# Patient Record
Sex: Male | Born: 1950 | Race: White | Hispanic: No | Marital: Married | Smoking: Never smoker
Health system: Southern US, Community
[De-identification: ages and names within clinical notes are randomized; demographics above are authoritative.]

## PROBLEM LIST (undated history)

## (undated) DIAGNOSIS — I10 Essential (primary) hypertension: Secondary | ICD-10-CM

## (undated) DIAGNOSIS — T753XXA Motion sickness, initial encounter: Secondary | ICD-10-CM

---

## 2010-10-23 ENCOUNTER — Emergency Department (HOSPITAL_COMMUNITY)
Admission: EM | Admit: 2010-10-23 | Discharge: 2010-10-23 | Disposition: A | Discharge status: Home / Self Care | Payer: 59 | Attending: Emergency Medicine | Admitting: Emergency Medicine

## 2010-10-23 ENCOUNTER — Emergency Department (HOSPITAL_COMMUNITY): Payer: 59

## 2010-10-23 DIAGNOSIS — R209 Unspecified disturbances of skin sensation: Secondary | ICD-10-CM | POA: Insufficient documentation

## 2010-10-23 DIAGNOSIS — M79609 Pain in unspecified limb: Secondary | ICD-10-CM | POA: Insufficient documentation

## 2010-10-23 DIAGNOSIS — R0789 Other chest pain: Secondary | ICD-10-CM | POA: Insufficient documentation

## 2010-11-09 ENCOUNTER — Ambulatory Visit: Payer: 59 | Admitting: Family Medicine

## 2020-08-29 ENCOUNTER — Other Ambulatory Visit: Payer: Self-pay | Admitting: Orthopedic Surgery

## 2020-08-29 DIAGNOSIS — M25511 Pain in right shoulder: Secondary | ICD-10-CM

## 2020-09-06 ENCOUNTER — Ambulatory Visit
Admission: RE | Admit: 2020-09-06 | Discharge: 2020-09-06 | Disposition: A | Discharge status: Home / Self Care | Payer: 59 | Source: Ambulatory Visit | Attending: Orthopedic Surgery | Admitting: Orthopedic Surgery

## 2020-09-06 ENCOUNTER — Other Ambulatory Visit: Payer: Self-pay

## 2020-09-06 DIAGNOSIS — M25511 Pain in right shoulder: Secondary | ICD-10-CM

## 2020-09-20 ENCOUNTER — Other Ambulatory Visit: Payer: Self-pay | Admitting: Orthopedic Surgery

## 2020-09-21 ENCOUNTER — Encounter: Payer: Self-pay | Admitting: Orthopedic Surgery

## 2020-09-30 ENCOUNTER — Ambulatory Visit
Admission: RE | Admit: 2020-09-30 | Discharge: 2020-09-30 | Disposition: A | Discharge status: Home / Self Care | Payer: 59 | Attending: Orthopedic Surgery | Admitting: Orthopedic Surgery

## 2020-09-30 ENCOUNTER — Ambulatory Visit: Payer: 59 | Admitting: Anesthesiology

## 2020-09-30 ENCOUNTER — Encounter
Admission: RE | Disposition: A | Discharge status: Home / Self Care | Payer: Self-pay | Source: Home / Self Care | Attending: Orthopedic Surgery

## 2020-09-30 ENCOUNTER — Encounter: Payer: Self-pay | Admitting: Orthopedic Surgery

## 2020-09-30 ENCOUNTER — Other Ambulatory Visit: Payer: Self-pay

## 2020-09-30 DIAGNOSIS — Z6828 Body mass index (BMI) 28.0-28.9, adult: Secondary | ICD-10-CM | POA: Insufficient documentation

## 2020-09-30 DIAGNOSIS — M25411 Effusion, right shoulder: Secondary | ICD-10-CM | POA: Diagnosis not present

## 2020-09-30 DIAGNOSIS — M25811 Other specified joint disorders, right shoulder: Secondary | ICD-10-CM | POA: Insufficient documentation

## 2020-09-30 DIAGNOSIS — W109XXA Fall (on) (from) unspecified stairs and steps, initial encounter: Secondary | ICD-10-CM | POA: Insufficient documentation

## 2020-09-30 DIAGNOSIS — S46011A Strain of muscle(s) and tendon(s) of the rotator cuff of right shoulder, initial encounter: Secondary | ICD-10-CM | POA: Insufficient documentation

## 2020-09-30 DIAGNOSIS — M7521 Bicipital tendinitis, right shoulder: Secondary | ICD-10-CM | POA: Diagnosis not present

## 2020-09-30 DIAGNOSIS — M7551 Bursitis of right shoulder: Secondary | ICD-10-CM | POA: Insufficient documentation

## 2020-09-30 DIAGNOSIS — Z79899 Other long term (current) drug therapy: Secondary | ICD-10-CM | POA: Diagnosis not present

## 2020-09-30 HISTORY — PX: SHOULDER ARTHROSCOPY WITH SUBACROMIAL DECOMPRESSION AND OPEN ROTATOR C: SHX5688

## 2020-09-30 HISTORY — DX: Essential (primary) hypertension: I10

## 2020-09-30 HISTORY — DX: Motion sickness, initial encounter: T75.3XXA

## 2020-09-30 SURGERY — SHOULDER ARTHROSCOPY WITH SUBACROMIAL DECOMPRESSION AND OPEN ROTATOR CUFF REPAIR, OPEN BICEPS TENDON REPAIR
Anesthesia: Regional | Site: Shoulder | Laterality: Right

## 2020-09-30 MED ORDER — EPHEDRINE SULFATE 50 MG/ML IJ SOLN
INTRAMUSCULAR | Status: DC | PRN
  Administered 2020-09-30: 5 mg via INTRAVENOUS
  Administered 2020-09-30 (×2): 10 mg via INTRAVENOUS

## 2020-09-30 MED ORDER — LIDOCAINE HCL (CARDIAC) PF 100 MG/5ML IV SOSY
PREFILLED_SYRINGE | INTRAVENOUS | Status: DC | PRN
  Administered 2020-09-30: 30 mg via INTRATRACHEAL

## 2020-09-30 MED ORDER — BUPIVACAINE LIPOSOME 1.3 % IJ SUSP
INTRAMUSCULAR | Status: DC | PRN
  Administered 2020-09-30: 20 mL via PERINEURAL

## 2020-09-30 MED ORDER — MIDAZOLAM HCL 2 MG/2ML IJ SOLN
INTRAMUSCULAR | Status: DC | PRN
  Administered 2020-09-30: 2 mg via INTRAVENOUS

## 2020-09-30 MED ORDER — OXYCODONE HCL 5 MG PO TABS: 5.0000 mg | ORAL_TABLET | ORAL | 0 refills | Status: AC | PRN

## 2020-09-30 MED ORDER — PROPOFOL 10 MG/ML IV BOLUS
INTRAVENOUS | Status: DC | PRN
  Administered 2020-09-30: 150 mg via INTRAVENOUS
  Administered 2020-09-30: 50 mg via INTRAVENOUS

## 2020-09-30 MED ORDER — OXYCODONE HCL 5 MG PO TABS: 5.0000 mg | ORAL_TABLET | Freq: Once | ORAL | Status: DC | PRN

## 2020-09-30 MED ORDER — ONDANSETRON HCL 4 MG/2ML IJ SOLN
INTRAMUSCULAR | Status: DC | PRN
  Administered 2020-09-30: 4 mg via INTRAVENOUS

## 2020-09-30 MED ORDER — ACETAMINOPHEN 500 MG PO TABS: 1000.0000 mg | ORAL_TABLET | Freq: Three times a day (TID) | ORAL | 2 refills | Status: AC

## 2020-09-30 MED ORDER — FENTANYL CITRATE (PF) 100 MCG/2ML IJ SOLN
INTRAMUSCULAR | Status: DC | PRN
  Administered 2020-09-30: 100 ug via INTRAVENOUS

## 2020-09-30 MED ORDER — CEFAZOLIN SODIUM-DEXTROSE 2-4 GM/100ML-% IV SOLN
2.0000 g | INTRAVENOUS | Status: AC
  Administered 2020-09-30: 2 g via INTRAVENOUS

## 2020-09-30 MED ORDER — LACTATED RINGERS IV SOLN: INTRAVENOUS | Status: DC

## 2020-09-30 MED ORDER — BUPIVACAINE HCL (PF) 0.5 % IJ SOLN
INTRAMUSCULAR | Status: DC | PRN
  Administered 2020-09-30: 20 mL via PERINEURAL

## 2020-09-30 MED ORDER — DEXAMETHASONE SODIUM PHOSPHATE 4 MG/ML IJ SOLN
INTRAMUSCULAR | Status: DC | PRN
  Administered 2020-09-30: 4 mg via INTRAVENOUS

## 2020-09-30 MED ORDER — OXYCODONE HCL 5 MG/5ML PO SOLN: 5.0000 mg | Freq: Once | ORAL | Status: DC | PRN

## 2020-09-30 MED ORDER — ASPIRIN EC 325 MG PO TBEC: 325.0000 mg | DELAYED_RELEASE_TABLET | Freq: Every day | ORAL | 0 refills | Status: AC

## 2020-09-30 MED ORDER — CEFAZOLIN SODIUM-DEXTROSE 1-4 GM/50ML-% IV SOLN
INTRAVENOUS | Status: DC | PRN
  Administered 2020-09-30: 1 g via INTRAVENOUS

## 2020-09-30 MED ORDER — LACTATED RINGERS IR SOLN
Status: DC | PRN
  Administered 2020-09-30: 12000 mL

## 2020-09-30 MED ORDER — ONDANSETRON 4 MG PO TBDP: 4.0000 mg | ORAL_TABLET | Freq: Three times a day (TID) | ORAL | 0 refills | Status: AC | PRN

## 2020-09-30 MED ORDER — LACTATED RINGERS IV SOLN
INTRAVENOUS | Status: DC | PRN
  Administered 2020-09-30: 12000 mL

## 2020-09-30 SURGICAL SUPPLY — 61 items
ADAPTER IRRIG TUBE 2 SPIKE SOL (ADAPTER) ×6 IMPLANT
ADH SKN CLS APL DERMABOND .7 (GAUZE/BANDAGES/DRESSINGS) ×1
ADPR TBG 2 SPK PMP STRL ASCP (ADAPTER) ×2
ANCH SUT 2 SWLK 19.1 CLS EYLT (Anchor) ×1 IMPLANT
ANCH SUT 2.9 PUSHLOCK ANCH (Orthopedic Implant) ×1 IMPLANT
ANCHOR ICONIX SPEED 2.3 (Anchor) ×2 IMPLANT
ANCHOR ICONIX SPEED 2.3MM (Anchor) ×1 IMPLANT
ANCHOR SWIVELOCK BIO 4.75X19.1 (Anchor) ×3 IMPLANT
APL PRP STRL LF DISP 70% ISPRP (MISCELLANEOUS) ×1
BUR BR 5.5 12 FLUTE (BURR) ×3 IMPLANT
BUR RADIUS 4.0X18.5 (BURR) ×3 IMPLANT
CANNULA 5.75X7CM (CANNULA) ×1
CANNULA PART THRD DISP 5.75X7 (CANNULA) ×2 IMPLANT
CANNULA PARTIAL THREAD 2X7 (CANNULA) ×3 IMPLANT
CANNULA TWIST IN 8.25X7CM (CANNULA) ×3 IMPLANT
CHLORAPREP W/TINT 26 (MISCELLANEOUS) ×3 IMPLANT
COOLER POLAR GLACIER W/PUMP (MISCELLANEOUS) ×3 IMPLANT
COVER LIGHT HANDLE UNIVERSAL (MISCELLANEOUS) ×6 IMPLANT
DERMABOND ADVANCED (GAUZE/BANDAGES/DRESSINGS) ×2
DERMABOND ADVANCED .7 DNX12 (GAUZE/BANDAGES/DRESSINGS) ×1 IMPLANT
DRAPE IMP U-DRAPE 54X76 (DRAPES) ×6 IMPLANT
DRAPE INCISE IOBAN 66X45 STRL (DRAPES) ×3 IMPLANT
DRAPE U-SHAPE 48X52 POLY STRL (PACKS) ×6 IMPLANT
DRSG TEGADERM 4X4.75 (GAUZE/BANDAGES/DRESSINGS) ×15 IMPLANT
ELECT REM PT RETURN 9FT ADLT (ELECTROSURGICAL) ×3
ELECTRODE REM PT RTRN 9FT ADLT (ELECTROSURGICAL) ×1 IMPLANT
GAUZE SPONGE 4X4 12PLY STRL (GAUZE/BANDAGES/DRESSINGS) ×3 IMPLANT
GAUZE XEROFORM 1X8 LF (GAUZE/BANDAGES/DRESSINGS) ×3 IMPLANT
GLOVE SRG 8 PF TXTR STRL LF DI (GLOVE) ×2 IMPLANT
GLOVE SURG ENC MOIS LTX SZ7.5 (GLOVE) ×6 IMPLANT
GLOVE SURG UNDER POLY LF SZ8 (GLOVE) ×6
GOWN STRL REIN 2XL XLG LVL4 (GOWN DISPOSABLE) ×3 IMPLANT
GOWN STRL REUS W/ TWL LRG LVL3 (GOWN DISPOSABLE) ×1 IMPLANT
GOWN STRL REUS W/TWL LRG LVL3 (GOWN DISPOSABLE) ×3
IV LACTATED RINGER IRRG 3000ML (IV SOLUTION) ×24
IV LR IRRIG 3000ML ARTHROMATIC (IV SOLUTION) ×8 IMPLANT
KIT STABILIZATION SHOULDER (MISCELLANEOUS) ×3 IMPLANT
KIT TURNOVER KIT A (KITS) ×3 IMPLANT
MANIFOLD 4PT FOR NEPTUNE1 (MISCELLANEOUS) ×3 IMPLANT
MASK FACE SPIDER DISP (MASK) ×3 IMPLANT
MAT ABSORB  FLUID 56X50 GRAY (MISCELLANEOUS) ×6
MAT ABSORB FLUID 56X50 GRAY (MISCELLANEOUS) ×2 IMPLANT
NDL SAFETY ECLIPSE 18X1.5 (NEEDLE) ×1 IMPLANT
NEEDLE HYPO 18GX1.5 SHARP (NEEDLE) ×3
PACK ARTHROSCOPY SHOULDER (MISCELLANEOUS) ×3 IMPLANT
PAD WRAPON POLAR SHDR XLG (MISCELLANEOUS) ×1 IMPLANT
PASSER SUT FIRSTPASS SELF (INSTRUMENTS) ×3 IMPLANT
PENCIL SMOKE EVACUATOR (MISCELLANEOUS) ×3 IMPLANT
SET TUBE SUCT SHAVER OUTFL 24K (TUBING) ×3 IMPLANT
SPONGE GAUZE 2X2 8PLY STER LF (GAUZE/BANDAGES/DRESSINGS) ×1
SPONGE GAUZE 2X2 8PLY STRL LF (GAUZE/BANDAGES/DRESSINGS) ×2 IMPLANT
SUT ETHILON 3-0 (SUTURE) ×3 IMPLANT
SUT PROLENE 2 0 CT2 30 (SUTURE) ×3 IMPLANT
SYR 10ML LL (SYRINGE) ×3 IMPLANT
SYSTEM IMPL TENODESIS LNT 2.9 (Orthopedic Implant) ×3 IMPLANT
TAPE MICROFOAM 4IN (TAPE) ×3 IMPLANT
TUBING ARTHRO INFLOW-ONLY STRL (TUBING) ×3 IMPLANT
TUBING CONNECTING 10 (TUBING) ×2 IMPLANT
TUBING CONNECTING 10' (TUBING) ×1
WAND WEREWOLF FLOW 90D (MISCELLANEOUS) ×3 IMPLANT
WRAPON POLAR PAD SHDR XLG (MISCELLANEOUS) ×3

## 2020-09-30 NOTE — Transfer of Care (Signed)
Immediate Anesthesia Transfer of Care Note  Patient: Patrick Fowler  Procedure(s) Performed: Right shoulder arthroscopic vs mini-open rotator cuff repair, subacromial decompression, and biceps tenodesis - Dedra Skeens to assist (Right: Shoulder)  Patient Location: PACU  Anesthesia Type: General LMA, Regional  Level of Consciousness: awake, alert  and patient cooperative  Airway and Oxygen Therapy: Patient Spontanous Breathing and Patient connected to supplemental oxygen  Post-op Assessment: Post-op Vital signs reviewed, Patient's Cardiovascular Status Stable, Respiratory Function Stable, Patent Airway and No signs of Nausea or vomiting  Post-op Vital Signs: Reviewed and stable  Complications: No notable events documented.

## 2020-09-30 NOTE — Anesthesia Preprocedure Evaluation (Signed)
Anesthesia Evaluation  Patient identified by MRN, date of birth, ID band Patient awake    Reviewed: NPO status   History of Anesthesia Complications Negative for: history of anesthetic complications  Airway Mallampati: II  TM Distance: >3 FB Neck ROM: full   Comment: +beard Dental no notable dental hx.    Pulmonary neg pulmonary ROS,    Pulmonary exam normal        Cardiovascular Exercise Tolerance: Good hypertension, Normal cardiovascular exam     Neuro/Psych negative neurological ROS  negative psych ROS   GI/Hepatic negative GI ROS, Neg liver ROS,   Endo/Other  Morbid obesity (bmi = 30)  Renal/GU negative Renal ROS  negative genitourinary   Musculoskeletal  (+) Arthritis ,   Abdominal   Peds  Hematology negative hematology ROS (+)   Anesthesia Other Findings Pcp: Clent Jacks, PA at 10/29/2019   Reproductive/Obstetrics                             Anesthesia Physical Anesthesia Plan  ASA: 2  Anesthesia Plan: General LMA and Regional   Post-op Pain Management: GA combined w/ Regional for post-op pain   Induction:   PONV Risk Score and Plan: 2 and Ondansetron and Midazolam  Airway Management Planned:   Additional Equipment:   Intra-op Plan:   Post-operative Plan:   Informed Consent: I have reviewed the patients History and Physical, chart, labs and discussed the procedure including the risks, benefits and alternatives for the proposed anesthesia with the patient or authorized representative who has indicated his/her understanding and acceptance.       Plan Discussed with: CRNA  Anesthesia Plan Comments: (ISBlock with exparel)        Anesthesia Quick Evaluation

## 2020-09-30 NOTE — Anesthesia Procedure Notes (Signed)
Anesthesia Regional Block: Interscalene brachial plexus block   Pre-Anesthetic Checklist: , timeout performed,  Correct Patient, Correct Site, Correct Laterality,  Correct Procedure, Correct Position, site marked,  Risks and benefits discussed,  Surgical consent,  Pre-op evaluation,  At surgeon's request and post-op pain management  Laterality: Right  Prep: chloraprep       Needles:  Injection technique: Single-shot  Needle Type: Stimiplex     Needle Length: 10cm  Needle Gauge: 21     Additional Needles:   Procedures:,,,, ultrasound used (permanent image in chart),,    Narrative:  Start time: 09/30/2020 9:18 AM End time: 09/30/2020 9:23 AM Injection made incrementally with aspirations every 5 mL.  Performed by: Personally  Anesthesiologist: Orrin Brigham, MD  Additional Notes: Functioning IV was confirmed and monitors applied. Ultrasound guidance: relevant anatomy identified, needle position confirmed, local anesthetic spread visualized around nerve(s)., vascular puncture avoided.  Image printed for medical record.  Negative aspiration and no paresthesias; incremental administration of local anesthetic. The patient tolerated the procedure well. Vitals signes recorded in RN notes.

## 2020-09-30 NOTE — Discharge Instructions (Addendum)

## 2020-09-30 NOTE — Op Note (Signed)
SURGERY DATE: 09/30/2020   PRE-OP DIAGNOSIS:  1. Right subacromial impingement 2. Right biceps tendinopathy 3. Right high-grade partial thickness rotator cuff tear   POST-OP DIAGNOSIS: 1. Right subacromial impingement 2. Right biceps tendinopathy 3. Right high-grade partial thickness rotator cuff tear   PROCEDURES:  1. Right arthroscopic rotator cuff repair 2. Right arthroscopic biceps tenodesis 3. Right arthroscopic subacromial decompression 4. Right arthroscopic extensive debridement of shoulder (glenohumeral and subacromial spaces)   SURGEON: Rosealee Albee, MD   ASSISTANT: Sonny Dandy, PA   ANESTHESIA: Gen with Exparil interscalene block   ESTIMATED BLOOD LOSS: 5cc   DRAINS:  none   TOTAL IV FLUIDS: per anesthesia      SPECIMENS: none   IMPLANTS:  - Arthrex 2.52mm PushLock x 1 - Arthrex 4.40mm SwiveLock x 1 - Iconix SPEED double loaded with 1.2 and 2.44mm tape x 1     OPERATIVE FINDINGS:  Examination under anesthesia: A careful examination under anesthesia was performed.  Passive range of motion was: FF: 150; ER at side: 45; ER in abduction: 90; IR in abduction: 45.  Anterior load shift: NT.  Posterior load shift: NT.  Sulcus in neutral: NT.  Sulcus in ER: NT.     Intra-operative findings: A thorough arthroscopic examination of the shoulder was performed.  The findings are: 1. Biceps tendon: tendinopathy with significant erythema  2. Superior labrum: erythema 3. Posterior labrum and capsule: normal 4. Inferior capsule and inferior recess: normal 5. Glenoid cartilage surface: Normal 6. Supraspinatus attachment: high-grade partial thickness tear of the supraspinatus on both the articular and bursal sides 7. Posterior rotator cuff attachment: normal 8. Humeral head articular cartilage: Grade 1 degenerative changes 9. Rotator interval: significant synovitis 10: Subscapularis tendon: attachment intact 11. Anterior labrum: Mildly degenerative 12. IGHL: normal    OPERATIVE REPORT:    Indications for procedure:  Patrick Fowler is a 70 y.o. male who had a fall approximately 3 months ago. He developed right shoulder pain afterwards.  He has had difficulty with overhead motion since that time with sensations of weakness. Clinical exam and MRI were suggestive of rotator cuff tear, biceps tendinopathy, and subacromial impingement. After discussion of risks, benefits, and alternatives to surgery, the patient elected to proceed.    Procedure in detail:   I identified Patrick Fowler in the pre-operative holding area.  I marked the operative shoulder with my initials. I reviewed the risks and benefits of the proposed surgical intervention, and the patient wished to proceed.  Anesthesia was then performed with an Exparel interscalene block.  The patient was transferred to the operative suite and placed in the beach chair position.     Appropriate IV antibiotics were administered prior to incision. The operative upper extremity was then prepped and draped in standard fashion. A time out was performed confirming the correct extremity, correct patient, and correct procedure.    I then created a standard posterior portal with an 11 blade. The glenohumeral joint was easily entered with a blunt trocar and the arthroscope introduced. The findings of diagnostic arthroscopy are described above. I debrided degenerative tissue including the synovitic tissue about the rotator interval and anterior and superior labrum. I also debrided the articular side of the supraspinatus in the region of partial-thickness tearing. I then coagulated the inflamed synovium to obtain hemostasis and reduce the risk of post-operative swelling using an Arthrocare radiofrequency device.   I then turned my attention to the arthroscopic biceps tenodesis.  I used the Loop n [employer]  technique to pass a FiberTape through the biceps in a locked fashion adjacent to the biceps anchor.  A hole for a 2.9 mm  Arthrex PushLock was drilled in the bicipital groove just superior to the subscapularis tendon insertion.  The biceps tendon was then cut.  The FiberTape was loaded onto the PushLock anchor and impacted into place into the previously drilled hole in the bicipital groove.  This appropriately secured the biceps into the bicipital groove and took it off of tension. The partial thickness portion of the supraspinatus was marked with a 2-0 Prolene suture for later identification in the subacromial space.    Next, the arthroscope was then introduced into the subacromial space. A direct lateral portal was created with an 11-blade after spinal needle localization. An extensive subacromial bursectomy was performed using a combination of the shaver and Arthrocare wand. The entire acromial undersurface was exposed and the CA ligament was subperiosteally elevated to expose the anterior acromial hook. A burr was used to create a flat anterior and lateral aspect of the acromion, converting it from a Type 2 to a Type 1 acromion. Care was made to keep the deltoid fascia intact.   Next I created an accessory anteroolateral portal to assist with visualization and instrumentation.  I debrided the bursal side of the supraspinatus in the region of the marked suture. There was minimal tissue on the bursal side and the shaver was able to remove the low quality bursal sided supraspinatus tissue. This resulted in a small L-shaped tear of the supraspinatus with long limb anterior.  I prepared the footprint using a burr to expose bleeding bone.    I then percutaneously placed 1 Iconix SPEED medial row anchor at the articular margin. I then shuttled all 4 strands of tape through the anterior portion of the rotator cuff using a FirstPass suture passer spanning the anterior to posterior extent of the tear. The 4 strands of suture were passed through an [employer] anchor.  This was placed approximately 2 cm distal to the lateral edge  of the footprint posterior to the tear with appropriate tensioning of each suture prior to final fixation.  This anterior to posterior pull allowed for excellent reduction of the supraspinatus tear to its native footprint without undue tension.  Appropriate compression was achieved.  The repair was stable to external and internal rotation.   Fluid was evacuated from the shoulder, and the portals were closed with 3-0 Nylon. Xeroform was applied to the portals. A sterile dressing was applied, followed by a Polar Care sleeve and a SlingShot shoulder immobilizer/sling. The patient was awakened from anesthesia without difficulty and was transferred to the PACU in stable condition.    Of note, assistance from a PA was essential to performing the surgery.  PA was present for the entire surgery.  PA assisted with patient positioning, retraction, instrumentation, and wound closure. The surgery would have been more difficult and had longer operative time without PA assistance.    COMPLICATIONS: none   DISPOSITION: plan for discharge home after recovery in PACU     POSTOPERATIVE PLAN: Remain in sling (except hygiene and elbow/wrist/hand RoM exercises as instructed by PT) x 6 weeks and NWB for this time. PT to begin 3-4 days after surgery.  Large rotator cuff repair rehab protocol. ASA 325mg  daily x 2 weeks for DVT ppx.

## 2020-09-30 NOTE — Anesthesia Postprocedure Evaluation (Signed)
Anesthesia Post Note  Patient: Patrick Fowler  Procedure(s) Performed: Right shoulder arthroscopic vs mini-open rotator cuff repair, subacromial decompression, and biceps tenodesis - Dedra Skeens to assist (Right: Shoulder)     Patient location during evaluation: PACU Anesthesia Type: Regional Level of consciousness: awake and alert Pain management: pain level controlled Vital Signs Assessment: post-procedure vital signs reviewed and stable Respiratory status: spontaneous breathing, nonlabored ventilation, respiratory function stable and patient connected to nasal cannula oxygen Cardiovascular status: blood pressure returned to baseline and stable Postop Assessment: no apparent nausea or vomiting Anesthetic complications: no   No notable events documented.  Orrin Brigham

## 2020-09-30 NOTE — Anesthesia Procedure Notes (Signed)
Procedure Name: LMA Insertion Date/Time: 09/30/2020 10:12 AM Performed by: Maree Krabbe, CRNA Pre-anesthesia Checklist: Patient identified, Emergency Drugs available, Suction available, Timeout performed and Patient being monitored Patient Re-evaluated:Patient Re-evaluated prior to induction Oxygen Delivery Method: Circle system utilized Preoxygenation: Pre-oxygenation with 100% oxygen Induction Type: IV induction LMA: LMA inserted LMA Size: 5.0 Number of attempts: 1 Placement Confirmation: positive ETCO2 and breath sounds checked- equal and bilateral Tube secured with: Tape Dental Injury: Teeth and Oropharynx as per pre-operative assessment

## 2020-09-30 NOTE — H&P (Signed)
Paper H&P to be scanned into permanent record. H&P reviewed. No significant changes noted.  

## 2020-10-03 ENCOUNTER — Encounter: Payer: Self-pay | Admitting: Orthopedic Surgery

## 2022-11-17 IMAGING — MR MR SHOULDER*R* W/O CM
4 of 5 series · 26 of 40 positions shown · non-contrast
Comparison: Radiograph 10/23/2010

CLINICAL DATA: Right shoulder pain over the last 2 weeks after a
fall. Limited range of motion.

EXAM:
MRI OF THE RIGHT SHOULDER WITHOUT CONTRAST
TECHNIQUE: Multiplanar, multisequence MR imaging of the shoulder was performed.
No intravenous contrast was administered.

[Series 3: T2 fat-sat · axial · 4.0mm · 0.27mm/px · z∈[-86,+51]mm · 9 of 30 slices shown (1 of 3)]
[im 1/30]
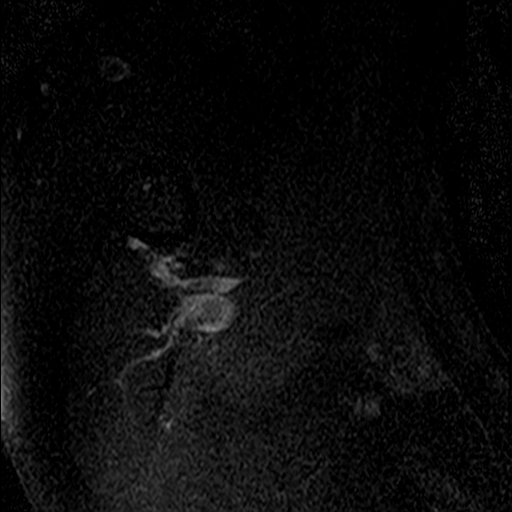
[im 6/30]
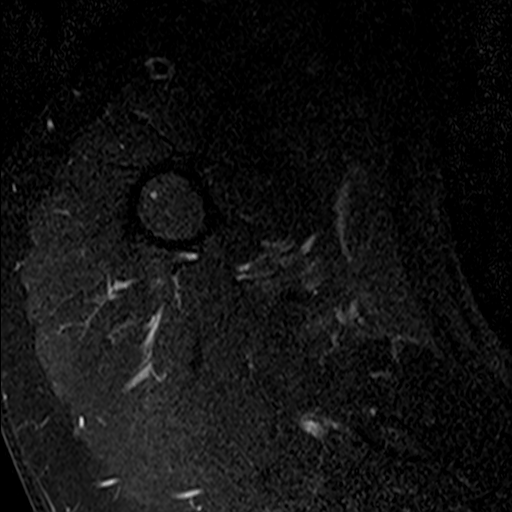
[im 8/30]
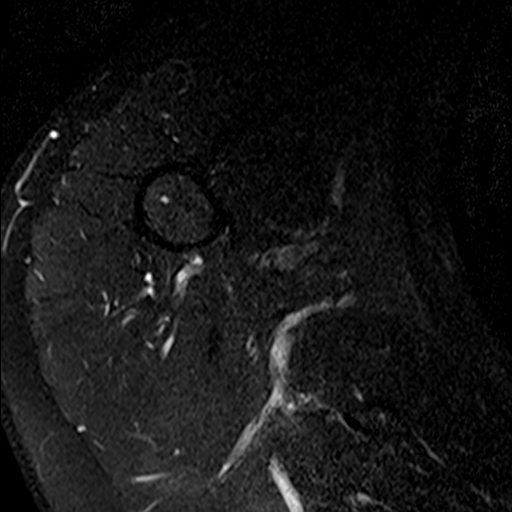
[im 14/30]
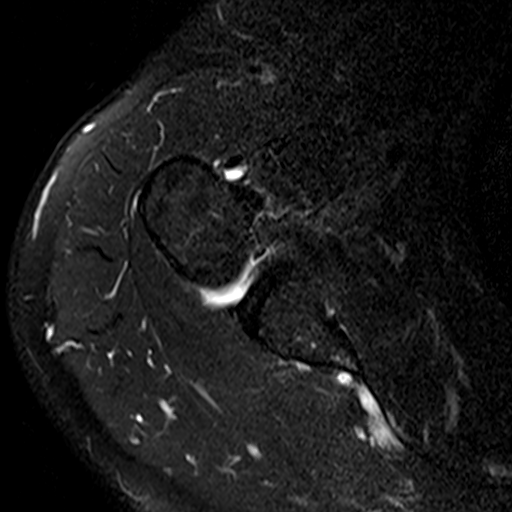
[im 16/30]
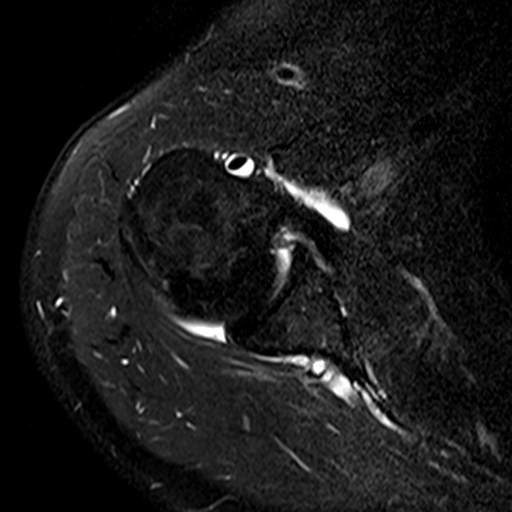
[im 22/30]
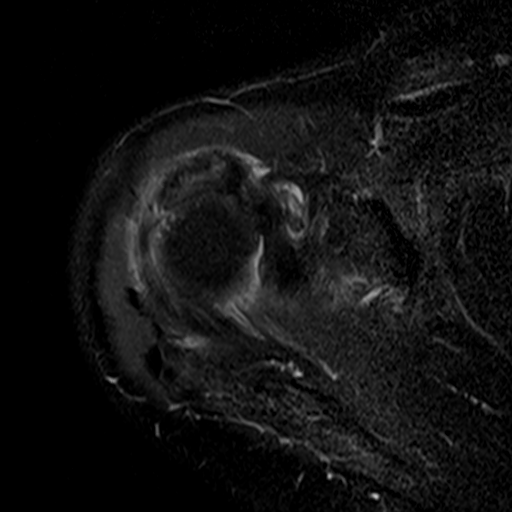
[im 24/30]
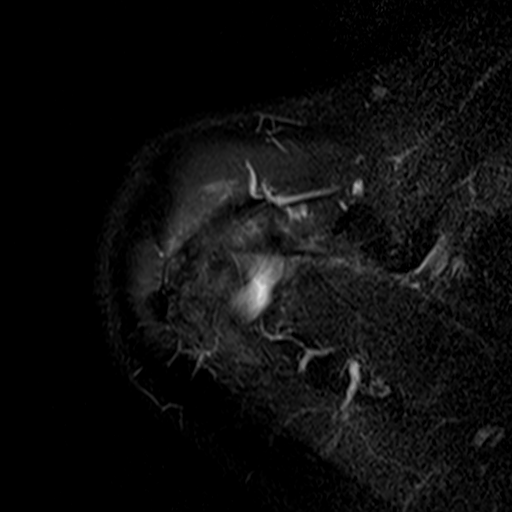
[im 27/30]
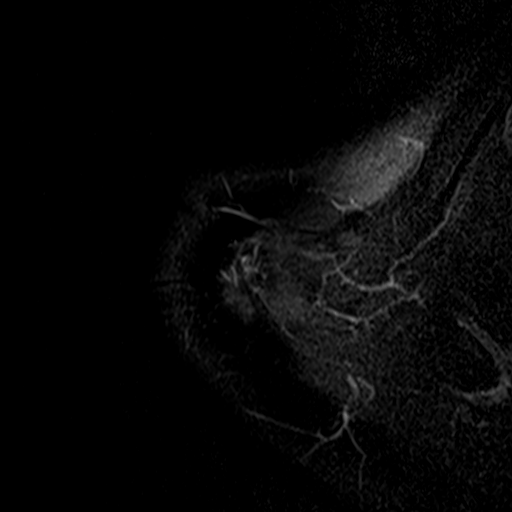
[im 30/30]
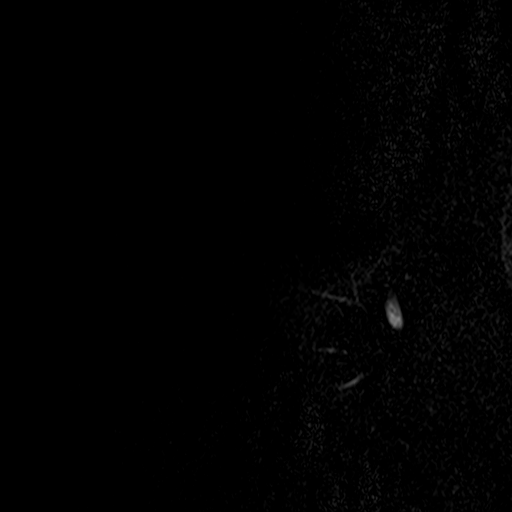

[Series 4: T2 fat-sat · oblique · 4.0mm · 0.55mm/px · 7 of 18 slices shown (2 of 3)]
[im 1/18]
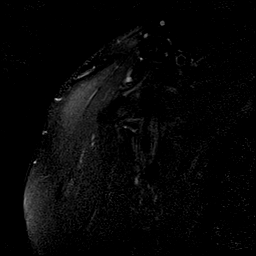
[im 3/18]
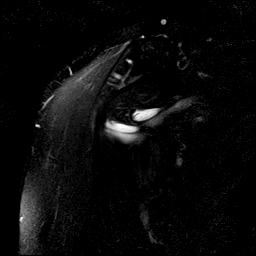
[im 6/18]
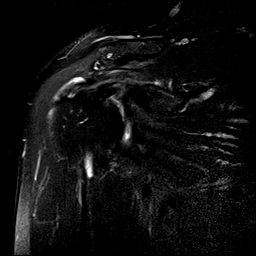
[im 9/18]
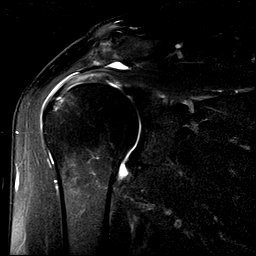
[im 12/18]
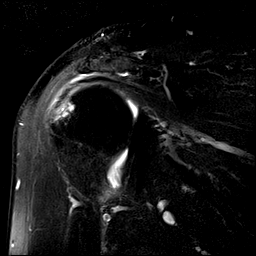
[im 15/18]
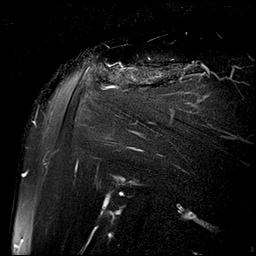
[im 18/18]
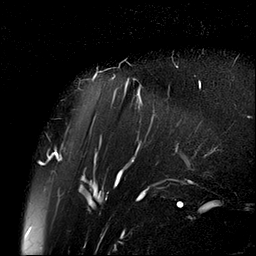

[Series 5: PD · oblique · 4.0mm · 0.27mm/px · 7 of 18 slices shown]
[im 1/18]
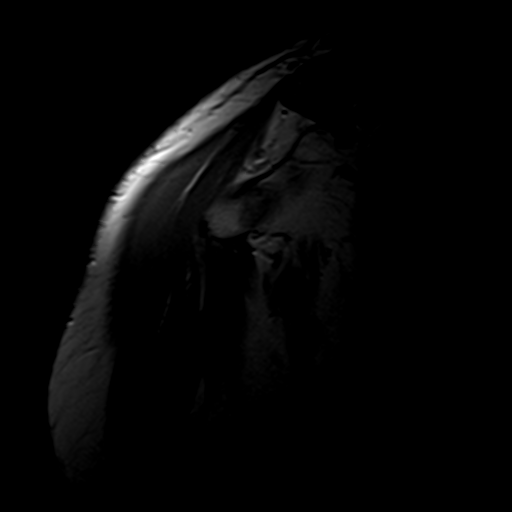
[im 3/18]
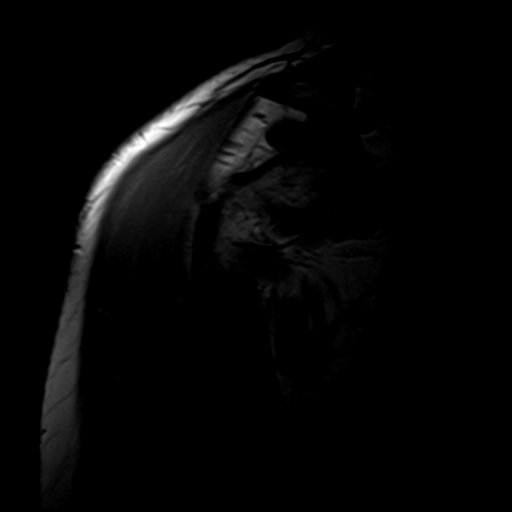
[im 6/18]
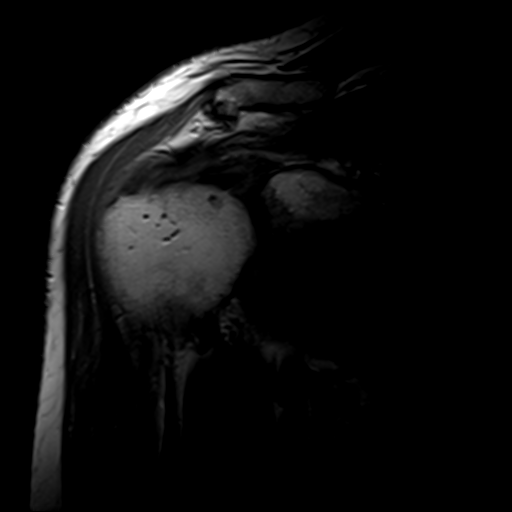
[im 9/18]
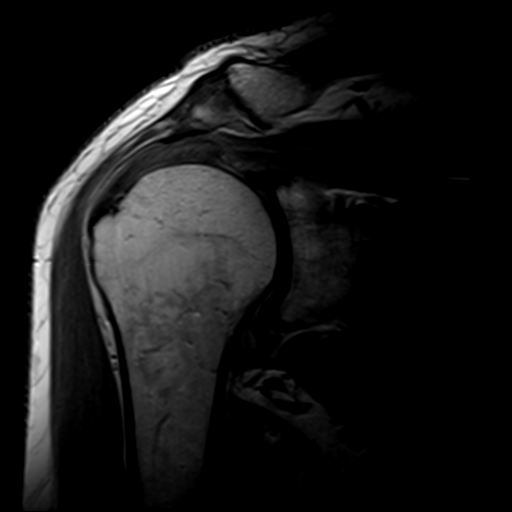
[im 12/18]
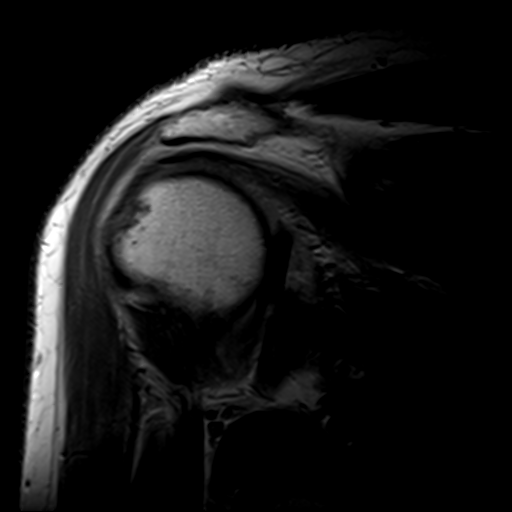
[im 15/18]
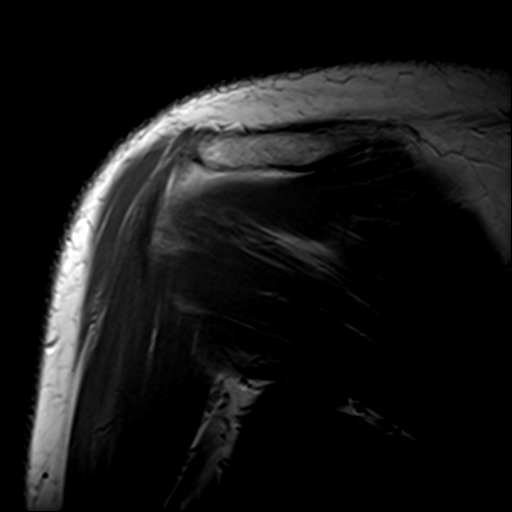
[im 18/18]
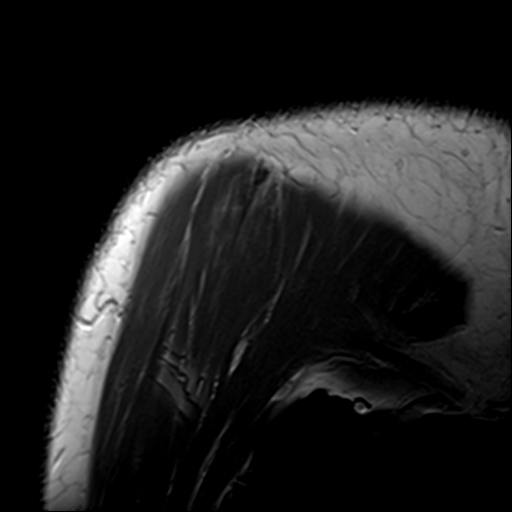

[Series 6: T2 fat-sat · oblique · 4.0mm · 0.55mm/px · 3 of 19 slices shown (3 of 3)]
[im 4/19]
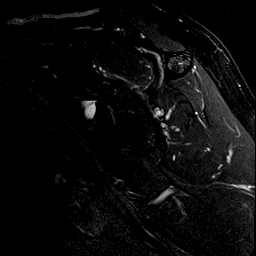
[im 10/19]
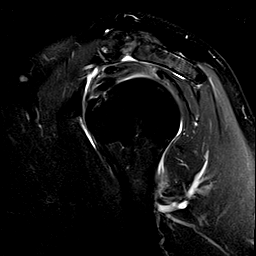
[im 16/19]
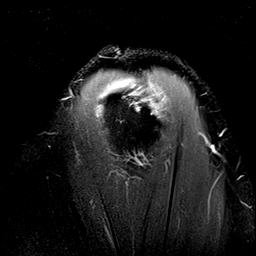

[26 of 40 positions shown; findings below may reference images not displayed]

FINDINGS: Rotator cuff: Moderate to prominent supraspinatus and infraspinatus
tendinopathy with partial thickness articular surface tearing in the
supraspinatus and infraspinatus tendons. Mild subscapularis
tendinopathy.

Muscles:  Unremarkable

Biceps long head:  Mild tendinopathy of the intra-articular segment

Acromioclavicular Joint: Mild degenerative spurring. Type II
acromion. There is fluid in the subacromial subdeltoid bursa and
subcoracoid bursa.

Glenohumeral Joint: Small glenohumeral joint effusion. Moderate
degenerative chondral thinning along the humeral head and superiorly
in the glenoid.

Labrum:  Grossly unremarkable

Bones: Confluent degenerative subcortical cystic lesions
posterolaterally along the anatomic neck of the humerus.

Other: No supplemental non-categorized findings.
IMPRESSION: 1. Moderate to prominent supraspinatus and infraspinatus
tendinopathy with partial thickness articular surface tearing but no
full-thickness tear identified.
2. Mild subscapularis and biceps tendinopathy.
3. Subacromial subdeltoid bursitis likely communicating with the
subcoracoid bursa.
4. Mild degenerative AC joint arthropathy and moderate degenerative
chondral thinning in the glenohumeral joint.
5. Small glenohumeral joint effusion.
